# Patient Record
Sex: Male | Born: 1990 | Race: White | Hispanic: No | Marital: Single
Health system: Southern US, Community
[De-identification: ages and names within clinical notes are randomized; demographics above are authoritative.]

---
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
## 2010-04-18 ENCOUNTER — Emergency Department: Payer: Self-pay | Admitting: Emergency Medicine
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
## 2010-12-16 IMAGING — CR DG ANKLE COMPLETE 3+V*L*
1 series · 5 of 5 positions shown · non-contrast
Comparison: none

REASON FOR EXAM: fall..pain /swelling ..pt [facility]
COMMENTS:   LMP: (Male)

[Series 1: view not recorded · 0.17mm/px · 5 of 5 slices shown]
[im 1/5]
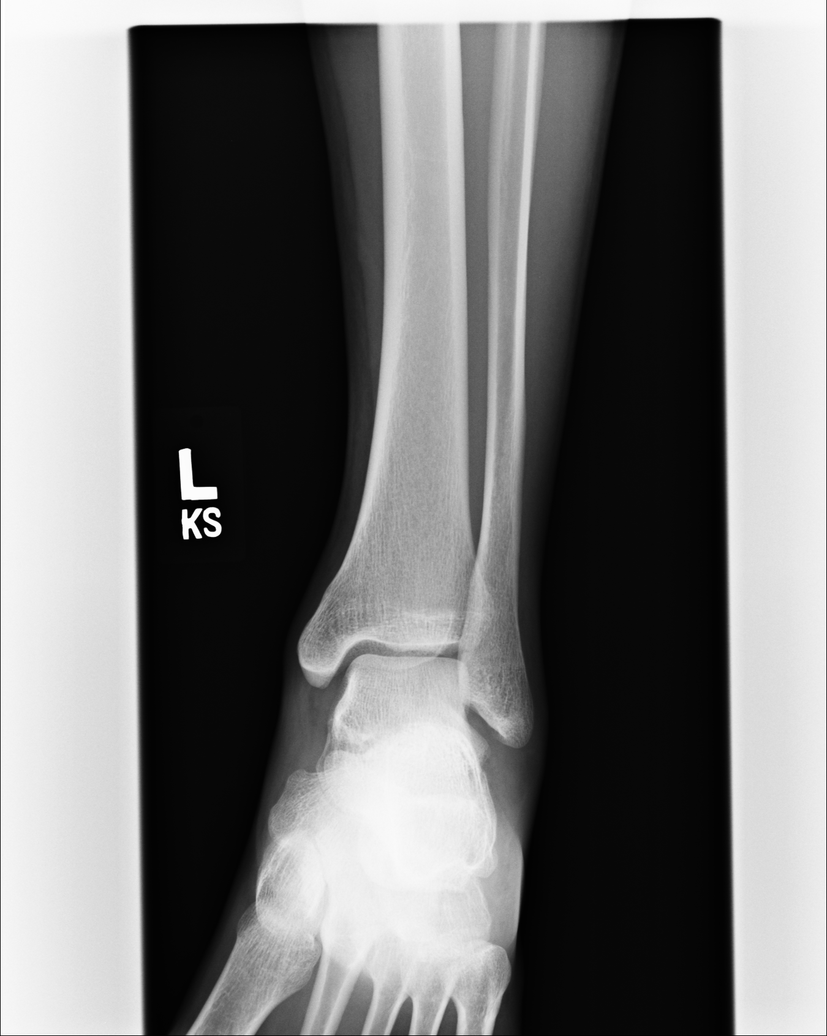
[im 2/5]
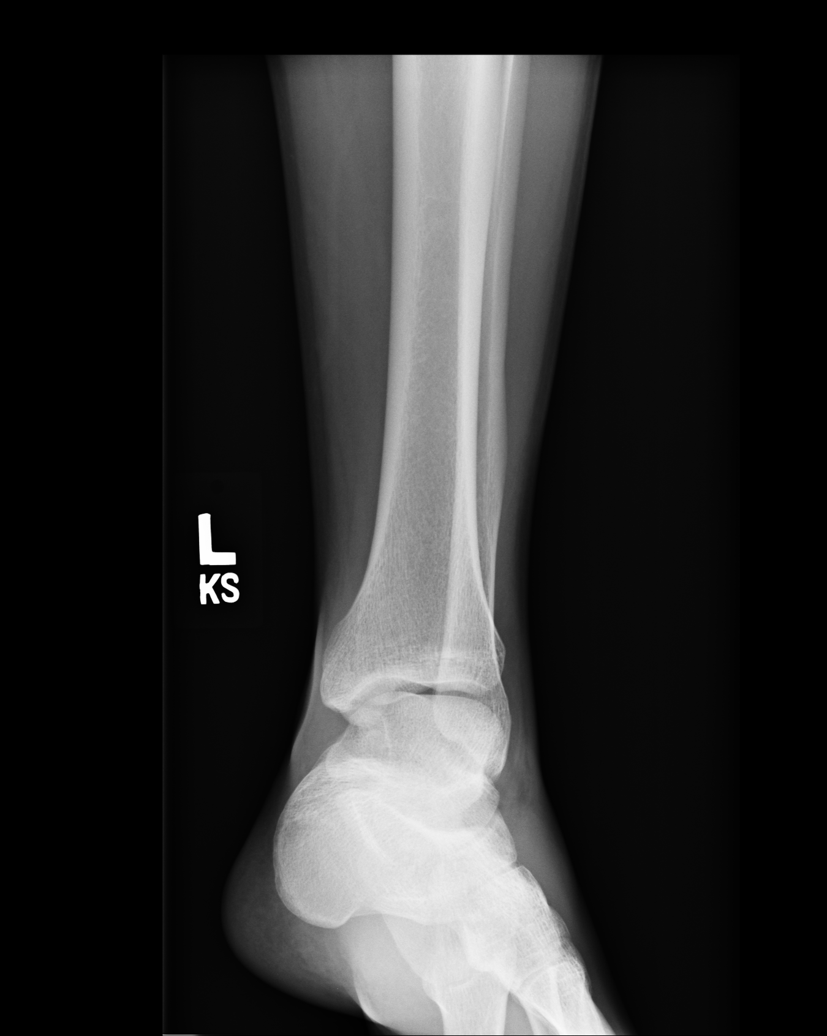
[im 3/5]
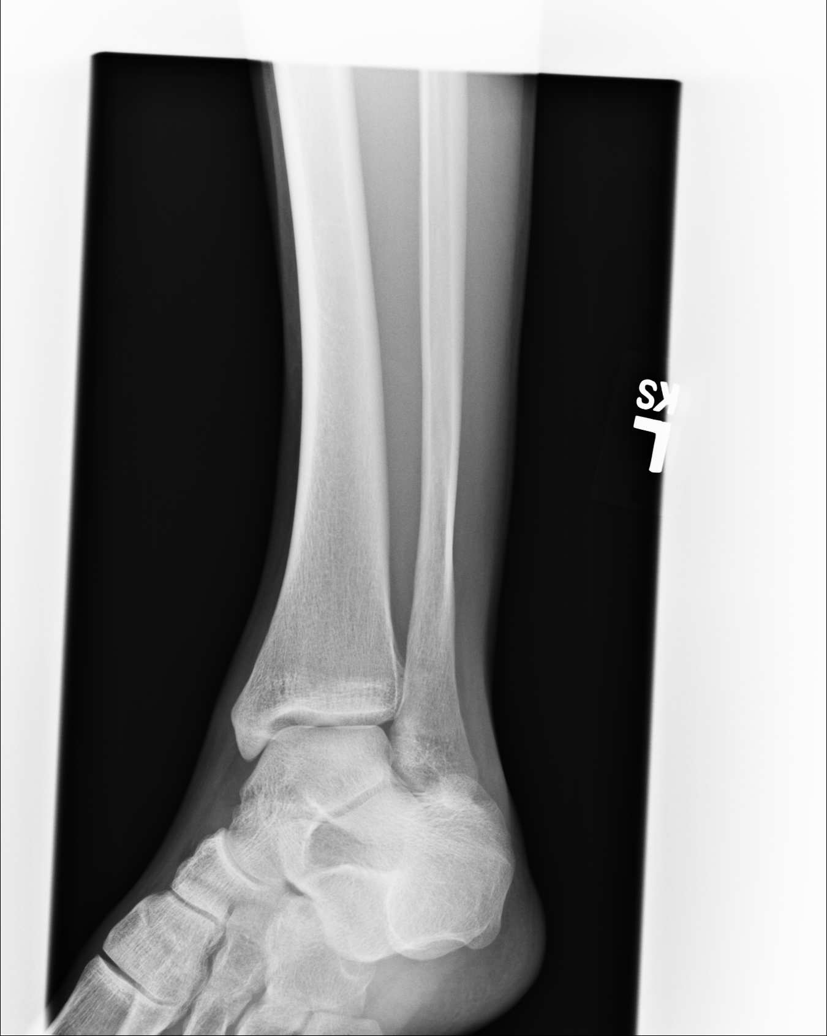
[im 4/5]
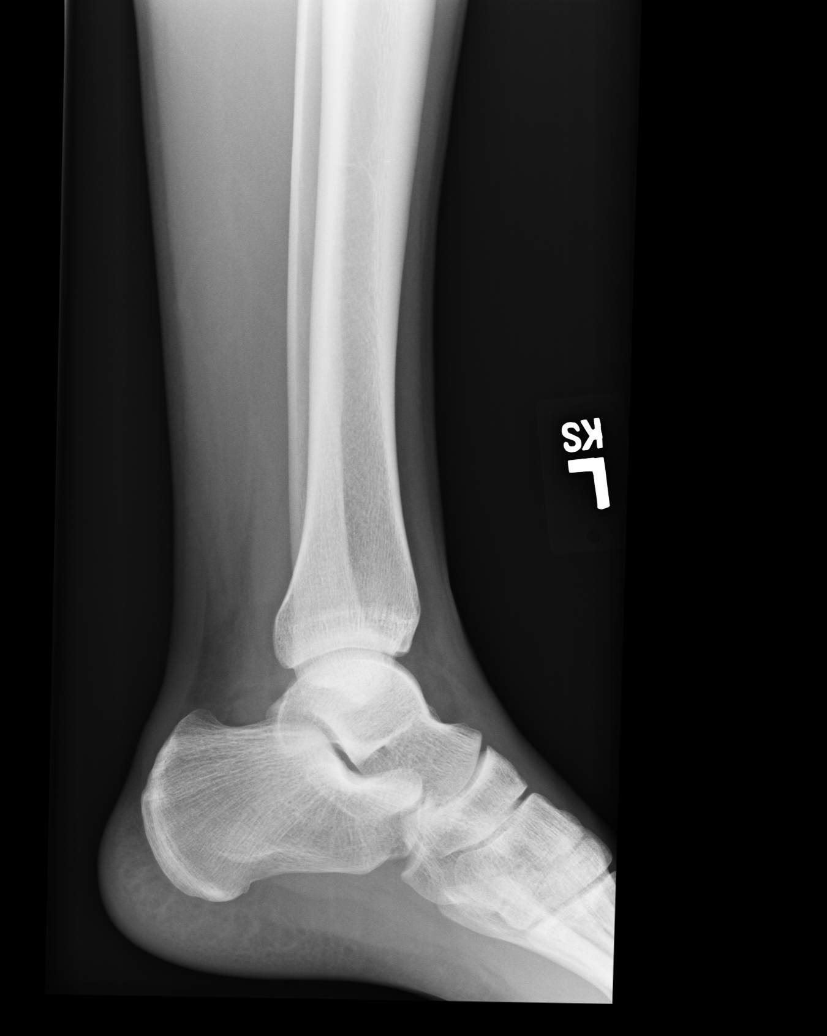
[im 5/5]
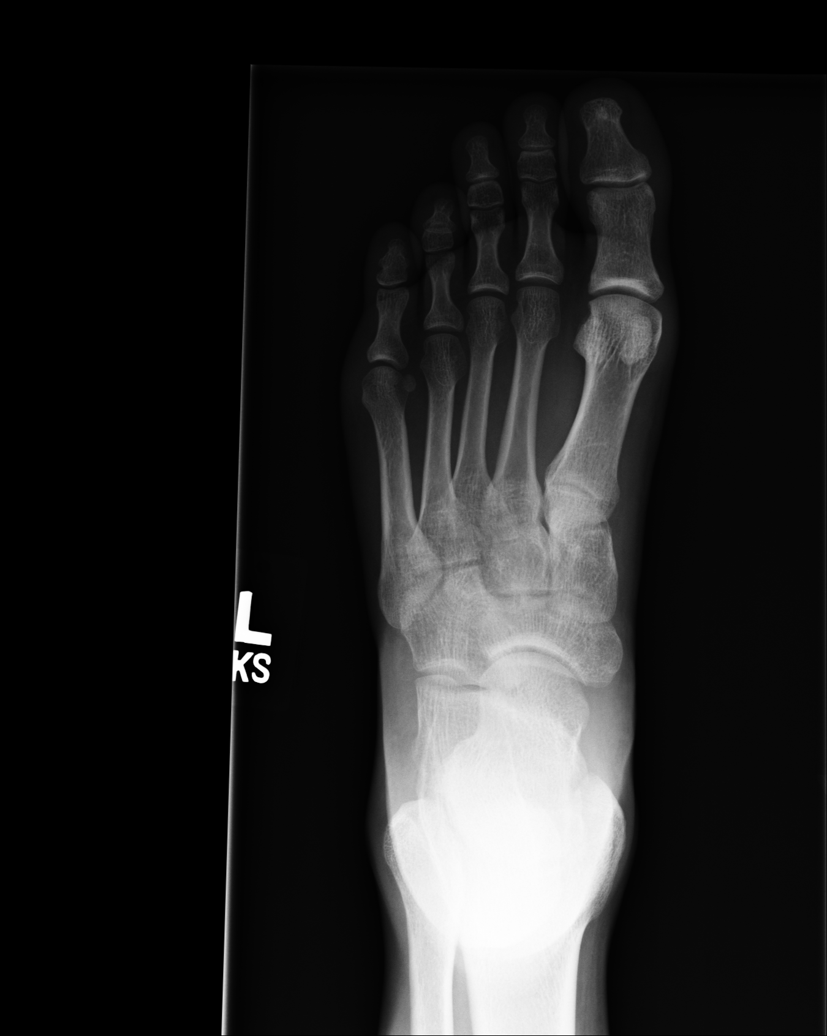

[5 of 5 positions shown; findings below may reference images not displayed]

PROCEDURE:     DXR - DXR ANKLE LEFT COMPLETE  - December 11, 2009 [DATE]

RESULT:     Multiple views of the left ankle are submitted. The ankle joint
mortise is preserved. The talar dome is intact. I do not see evidence of an
acute malleolar fracture. No significant soft tissue swelling is identified.
The metatarsal bases appear intact.
IMPRESSION: I do not see acute bony abnormality of the left ankle.

## 2011-04-26 ENCOUNTER — Emergency Department: Payer: Self-pay | Admitting: *Deleted

## 2011-05-01 ENCOUNTER — Emergency Department: Payer: Self-pay | Admitting: Emergency Medicine

## 2012-06-14 ENCOUNTER — Emergency Department: Payer: Self-pay | Admitting: Emergency Medicine

## 2012-11-12 ENCOUNTER — Emergency Department: Payer: Self-pay

## 2012-11-12 LAB — DRUG SCREEN, URINE
Benzodiazepine, Ur Scrn: NEGATIVE (ref ?–200)
Cannabinoid 50 Ng, Ur ~~LOC~~: POSITIVE (ref ?–50)
Cocaine Metabolite,Ur ~~LOC~~: NEGATIVE (ref ?–300)
MDMA (Ecstasy)Ur Screen: NEGATIVE (ref ?–500)
Methadone, Ur Screen: NEGATIVE (ref ?–300)
Opiate, Ur Screen: NEGATIVE (ref ?–300)
Phencyclidine (PCP) Ur S: NEGATIVE (ref ?–25)

## 2012-11-12 LAB — COMPREHENSIVE METABOLIC PANEL
Albumin: 4.1 g/dL (ref 3.4–5.0)
BUN: 10 mg/dL (ref 7–18)
Chloride: 106 mmol/L (ref 98–107)
Co2: 30 mmol/L (ref 21–32)
Creatinine: 1.35 mg/dL — ABNORMAL HIGH (ref 0.60–1.30)
EGFR (African American): >60
Osmolality: 280 (ref 275–301)
SGOT(AST): 23 U/L (ref 15–37)
SGPT (ALT): 26 U/L (ref 12–78)
Sodium: 141 mmol/L (ref 136–145)
Total Protein: 8.3 g/dL — ABNORMAL HIGH (ref 6.4–8.2)

## 2012-11-12 LAB — CBC
HGB: 17.2 g/dL (ref 13.0–18.0)
MCV: 86 fL (ref 80–100)
Platelet: 306 10*3/uL (ref 150–440)
WBC: 10.2 10*3/uL (ref 3.8–10.6)

## 2012-11-12 LAB — URINALYSIS, COMPLETE
Bilirubin,UR: NEGATIVE
Ketone: NEGATIVE
Nitrite: NEGATIVE
Ph: 6 (ref 4.5–8.0)
RBC,UR: 1 /HPF (ref 0–5)

## 2012-11-12 LAB — ETHANOL
Ethanol %: 0.24 % — ABNORMAL HIGH (ref 0.000–0.080)
Ethanol: 240 mg/dL

## 2012-11-12 LAB — SALICYLATE LEVEL: Salicylates, Serum: 3 mg/dL — ABNORMAL HIGH

## 2012-11-12 LAB — ACETAMINOPHEN LEVEL: Acetaminophen: <2 ug/mL
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
